# Patient Record
Sex: Male | Born: 1985 | Race: White | Hispanic: No | Marital: Single | Smoking: Current every day smoker
Health system: Southern US, Community
[De-identification: ages and names within clinical notes are randomized; demographics above are authoritative.]

---

## 2015-10-01 ENCOUNTER — Emergency Department
Admission: EM | Admit: 2015-10-01 | Discharge: 2015-10-01 | Disposition: A | Discharge status: Home / Self Care | Payer: Self-pay | Attending: Emergency Medicine | Admitting: Emergency Medicine

## 2015-10-01 ENCOUNTER — Encounter: Payer: Self-pay | Admitting: Emergency Medicine

## 2015-10-01 DIAGNOSIS — Y939 Activity, unspecified: Secondary | ICD-10-CM | POA: Insufficient documentation

## 2015-10-01 DIAGNOSIS — W540XXA Bitten by dog, initial encounter: Secondary | ICD-10-CM | POA: Insufficient documentation

## 2015-10-01 DIAGNOSIS — Y999 Unspecified external cause status: Secondary | ICD-10-CM | POA: Insufficient documentation

## 2015-10-01 DIAGNOSIS — S51851A Open bite of right forearm, initial encounter: Secondary | ICD-10-CM

## 2015-10-01 DIAGNOSIS — S51831A Puncture wound without foreign body of right forearm, initial encounter: Secondary | ICD-10-CM | POA: Insufficient documentation

## 2015-10-01 DIAGNOSIS — L03113 Cellulitis of right upper limb: Secondary | ICD-10-CM | POA: Insufficient documentation

## 2015-10-01 DIAGNOSIS — F17210 Nicotine dependence, cigarettes, uncomplicated: Secondary | ICD-10-CM | POA: Insufficient documentation

## 2015-10-01 DIAGNOSIS — Y929 Unspecified place or not applicable: Secondary | ICD-10-CM | POA: Insufficient documentation

## 2015-10-01 MED ORDER — AMOXICILLIN-POT CLAVULANATE 875-125 MG PO TABS: 1.0000 | ORAL_TABLET | Freq: Two times a day (BID) | ORAL | Status: AC

## 2015-10-01 MED ORDER — IBUPROFEN 800 MG PO TABS: 800.0000 mg | ORAL_TABLET | Freq: Three times a day (TID) | ORAL | Status: AC | PRN

## 2015-10-01 MED ORDER — AMOXICILLIN-POT CLAVULANATE 875-125 MG PO TABS
1.0000 | ORAL_TABLET | Freq: Once | ORAL | Status: AC
  Administered 2015-10-01: 1 via ORAL
  Filled 2015-10-01: qty 1

## 2015-10-01 MED ORDER — IBUPROFEN 800 MG PO TABS
800.0000 mg | ORAL_TABLET | Freq: Once | ORAL | Status: AC
  Administered 2015-10-01: 800 mg via ORAL
  Filled 2015-10-01: qty 1

## 2015-10-01 NOTE — ED Notes (Signed)
Discharge instructions reviewed with patient. Questions fielded by this RN. Patient verbalizes understanding of instructions. Patient discharged home in stable condition per Gaines PA. No acute distress noted at time of discharge.   

## 2015-10-01 NOTE — Discharge Instructions (Signed)
Animal Bite Animal bites can range from mild to serious. An animal bite can result in a scratch on the skin, a deep open cut, a puncture of the skin, a crush injury, or tearing away of the skin or a body part. A small bite from a house pet will usually not cause serious problems. However, some animal bites can become infected or injure a bone or other tissue.  Bites from certain animals can be more dangerous because of the risk of spreading rabies, which is a serious viral infection. This risk is higher with bites from stray animals or wild animals, such as raccoons, foxes, skunks, and bats. Dogs are responsible for most animal bites. Children are bitten more often than adults. SYMPTOMS  Common symptoms of an animal bite include:   Pain.   Bleeding.   Swelling.   Bruising.  DIAGNOSIS  This condition may be diagnosed based on a physical exam and medical history. Your health care provider will examine the wound and ask for details about the animal and how the bite happened. You may also have tests, such as:   Blood tests to check for infection or to determine if surgery is needed.  X-rays to check for damage to bones or joints.  Culture test. This uses a sample of fluid from the wound to check for infection. TREATMENT  Treatment varies depending on the location and type of animal bite and your medical history. Treatment may include:   Wound care. This often includes cleaning the wound, flushing the wound with saline solution, and applying a bandage (dressing). Sometimes, the wound is left open to heal because of the high risk of infection. However, in some cases, the wound may be closed with stitches (sutures), staples, skin glue, or adhesive strips.   Antibiotic medicine.   Tetanus shot.   Rabies treatment if the animal could have rabies.  In some cases, bites that have become infected may require IV antibiotics and surgical treatment in the hospital.  [place]  Follow instructions from your health care provider about how to take care of your wound. Make sure you:  Wash your hands with soap and water before you change your dressing. If soap and water are not available, use hand sanitizer.  Change your dressing as told by your health care provider.  Leave sutures, skin glue, or adhesive strips in place. These skin closures may need to be in place for 2 weeks or longer. If adhesive strip edges start to loosen and curl up, you may trim the loose edges. Do not remove adhesive strips completely unless your health care provider tells you to do that.  Check your wound every day for signs of infection. Watch for:   Increasing redness, swelling, or pain.   Fluid, blood, or pus.  General Instructions  Take or apply over-the-counter and prescription medicines only as told by your health care provider.   If you were prescribed an antibiotic, take or apply it as told by your health care provider. Do not stop using the antibiotic even if your condition improves.   Keep the injured area raised (elevated) above the level of your heart while you are sitting or lying down, if this is possible.   If directed, apply ice to the injured area.   Put ice in a plastic bag.   Place a towel between your skin and the bag.   Leave the ice on for 20 minutes, 2-3 times per day.  Keep all follow-up visits as told by your health care provider. This is important.  SEEK MEDICAL CARE IF:  You have increasing redness, swelling, or pain at the site of your wound.   You have a general feeling of sickness (malaise).   You feel nauseous or you vomit.   You have pain that does not get better.  SEEK IMMEDIATE MEDICAL CARE IF:  You have a red streak extending away from your wound.   You have fluid, blood, or pus coming from your wound.   You have a fever or chills.   You have trouble moving your injured area.   You  have numbness or tingling extending beyond the wound.   This information is not intended to replace advice given to you by your health care provider. Make sure you discuss any questions you have with your health care provider.   Document Released: 01/07/2011 Document Revised: 01/10/2015 Document Reviewed: 09/06/2014 Elsevier Interactive Patient Education 2016 [employer]  Cellulitis Cellulitis is an infection of the skin and the tissue beneath it. The infected area is usually red and tender. Cellulitis occurs most often in the arms and lower legs.  CAUSES  Cellulitis is caused by bacteria that enter the skin through cracks or cuts in the skin. The most common types of bacteria that cause cellulitis are staphylococci and streptococci. SIGNS AND SYMPTOMS   Redness and warmth.  Swelling.  Tenderness or pain.  Fever. DIAGNOSIS  Your health care provider can usually determine what is wrong based on a physical exam. Blood tests may also be done. TREATMENT  Treatment usually involves taking an antibiotic medicine. HOME CARE INSTRUCTIONS   Take your antibiotic medicine as directed by your health care provider. Finish the antibiotic even if you start to feel better.  Keep the infected arm or leg elevated to reduce swelling.  Apply a warm cloth to the affected area up to 4 times per day to relieve pain.  Take medicines only as directed by your health care provider.  Keep all follow-up visits as directed by your health care provider. SEEK MEDICAL CARE IF:   You notice red streaks coming from the infected area.  Your red area gets larger or turns dark in color.  Your bone or joint underneath the infected area becomes painful after the skin has healed.  Your infection returns in the same area or another area.  You notice a swollen bump in the infected area.  You develop new symptoms.  You have a fever. SEEK IMMEDIATE MEDICAL CARE IF:   You feel very sleepy.  You develop  vomiting or diarrhea.  You have a general ill feeling (malaise) with muscle aches and pains.   This information is not intended to replace advice given to you by your health care provider. Make sure you discuss any questions you have with your health care provider.   Document Released: 01/29/2005 Document Revised: 01/10/2015 Document Reviewed: 07/07/2011 Elsevier Interactive Patient Education 2016 [employer]  Please take Augmentin and ibuprofen as prescribed. He may apply ice 20 minutes every hour for the next 48 hours to the forearm. If any fevers, increased pain, swelling, redness, return to the emergency department.

## 2015-10-01 NOTE — ED Provider Notes (Signed)
CSN: [id number]     Arrival date & time 10/01/15  1927 History   First MD Initiated Contact with Patient 10/01/15 2031     Chief Complaint  Patient presents with  . Animal Bite     (Consider location/radiation/quality/duration/timing/severity/associated sxs/prior Treatment) HPI  30 year old male presents to emergency department for evaluation of right forearm dog bite. Patient states he was bit yesterday along the right forearm by his brother's dog. Dog is up-to-date on all vaccinations. Dog resides with his parents in [place]. Patient has mild pain in the right forearm, 3 out of 10 with flexing and extending his right wrist. He came in today because of increased redness along the puncture and along the volar aspect of the right forearm. Patient has not been taking any medications for pain or inflammation. He is not allergic to any medications. His tetanus is up-to-date. He denies any numbness or tingling in right upper extremity.  History reviewed. No pertinent past medical history. History reviewed. No pertinent past surgical history. History reviewed. No pertinent family history. Social History  Substance Use Topics  . Smoking status: Current Every Day Smoker -- 0.50 packs/day    Types: Cigarettes  . Smokeless tobacco: Never Used  . Alcohol Use: Yes     Comment: occ    Review of Systems  Constitutional: Negative.  Negative for fever, chills, activity change and appetite change.  HENT: Negative for congestion, ear pain, mouth sores, rhinorrhea, sinus pressure, sore throat and trouble swallowing.   Eyes: Negative for photophobia, pain and discharge.  Respiratory: Negative for cough, chest tightness and shortness of breath.   Cardiovascular: Negative for chest pain and leg swelling.  Gastrointestinal: Negative for nausea, vomiting, abdominal pain, diarrhea and abdominal distention.  Genitourinary: Negative for dysuria and difficulty urinating.  Musculoskeletal: Negative for  back pain, arthralgias and gait problem.  Skin: Positive for wound. Negative for color change and rash.  Neurological: Negative for dizziness and headaches.  Hematological: Negative for adenopathy.  Psychiatric/Behavioral: Negative for behavioral problems and agitation.      Allergies  Review of patient's allergies indicates no known allergies.  Home Medications   Prior to Admission medications   Medication Sig Start Date End Date Taking? Authorizing Provider  amoxicillin-clavulanate (AUGMENTIN) 875-125 MG tablet Take 1 tablet by mouth every 12 (twelve) hours. 10 days 10/01/15   Evon Slack, PA-C  ibuprofen (ADVIL,MOTRIN) 800 MG tablet Take 1 tablet (800 mg total) by mouth every 8 (eight) hours as needed. 10/01/15   Evon Slack, PA-C   BP 124/75 mmHg  Pulse 82  Temp(Src) 98.6 F (37 C) (Oral)  Resp 16  Ht  (1.702 m)  Wt 76.204 kg  BMI 26.31 kg/m2  SpO2 99% Physical Exam  Constitutional: He is oriented to person, place, and time. He appears well-developed and well-nourished.  HENT:  Head: Normocephalic and atraumatic.  Eyes: Conjunctivae and EOM are normal. Pupils are equal, round, and reactive to light.  Neck: Normal range of motion. Neck supple.  Cardiovascular: Normal rate and intact distal pulses.   Pulmonary/Chest: Effort normal. No respiratory distress.  Abdominal: Soft. Bowel sounds are normal.  Musculoskeletal:  Examination of the right forearm shows the patient has mild swelling on the volar aspect of the forearm with a small single puncture wound with no drainage and no gaping skin wound. There is no palpable foreign body. Puncture wound is partially healed. There is 5 cm diameter of surrounding erythema around the puncture wound. There is mild  warmth. He has full flexion and extension of the wrist as well as full composite fist with no significant discomfort. He has 2+ radial pulse and 2+ cap refill. Sensation is intact throughout the right upper extremity.   Neurological: He is alert and oriented to person, place, and time.  Skin: Skin is warm and dry.  Psychiatric: He has a normal mood and affect. His behavior is normal. Judgment and thought content normal.    ED Course  Procedures (including critical care time) Labs Review Labs Reviewed - No data to display  Imaging Review No results found. I have personally reviewed and evaluated these images and lab results as part of my medical decision-making.   EKG Interpretation None      MDM   Final diagnoses:  Dog bite of right forearm, initial encounter  Cellulitis of right forearm  Puncture wound of right forearm, initial encounter    30 year old male with puncture wound to the right forearm by a family dog. Dog's vaccinations are up-to-date. Patient is placed on Augmentin 875-125, one tab by mouth twice a day for 10 days. Given strict instructions on follow up if any increased pain and swelling warmth or fevers.  Evon Slackhomas C Gaines, PA-C 10/01/15 2053  Minna AntisKevin Paduchowski, MD 10/01/15 [phone]

## 2015-10-01 NOTE — ED Notes (Signed)
Pt presents to ED for dog bite on right forearm yesterday. Puncture wound noted on right forearm.Noted redness and swelling. Family dog and states vaccinated.

## 2015-10-01 NOTE — ED Notes (Signed)
Pt presents to ED to be evaluated for dog bite on right forearm yesterday. Puncture wound noted on right forearm. Family dog and states vaccinated.

## 2018-05-31 ENCOUNTER — Encounter: Payer: Self-pay | Admitting: Emergency Medicine

## 2018-05-31 ENCOUNTER — Other Ambulatory Visit: Payer: Self-pay

## 2018-05-31 ENCOUNTER — Emergency Department
Admission: EM | Admit: 2018-05-31 | Discharge: 2018-05-31 | Disposition: A | Discharge status: Home / Self Care | Payer: Self-pay | Attending: Student in an Organized Health Care Education/Training Program | Admitting: Student in an Organized Health Care Education/Training Program

## 2018-05-31 DIAGNOSIS — L232 Allergic contact dermatitis due to cosmetics: Secondary | ICD-10-CM | POA: Insufficient documentation

## 2018-05-31 DIAGNOSIS — F17210 Nicotine dependence, cigarettes, uncomplicated: Secondary | ICD-10-CM | POA: Insufficient documentation

## 2018-05-31 MED ORDER — SULFAMETHOXAZOLE-TRIMETHOPRIM 800-160 MG PO TABS: 1.0000 | ORAL_TABLET | Freq: Two times a day (BID) | ORAL | 0 refills | Status: AC

## 2018-05-31 MED ORDER — METHYLPREDNISOLONE SODIUM SUCC 125 MG IJ SOLR
125.0000 mg | Freq: Once | INTRAMUSCULAR | Status: AC
  Administered 2018-05-31: 125 mg via INTRAMUSCULAR
  Filled 2018-05-31: qty 2

## 2018-05-31 MED ORDER — HYDROXYZINE HCL 50 MG PO TABS: 50.0000 mg | ORAL_TABLET | Freq: Three times a day (TID) | ORAL | 0 refills | Status: AC | PRN

## 2018-05-31 MED ORDER — METHYLPREDNISOLONE 4 MG PO TBPK: ORAL_TABLET | ORAL | 0 refills | Status: AC

## 2018-05-31 MED ORDER — HYDROXYZINE HCL 50 MG PO TABS
50.0000 mg | ORAL_TABLET | Freq: Once | ORAL | Status: AC
  Administered 2018-05-31: 50 mg via ORAL
  Filled 2018-05-31: qty 1

## 2018-05-31 NOTE — ED Notes (Signed)

## 2018-05-31 NOTE — ED Provider Notes (Signed)
[facility] Emergency Department Provider Note   ____________________________________________   First MD Initiated Contact with Patient 05/31/18 1219     (approximate)  I have reviewed the triage vital signs and the nursing notes.   HISTORY  Chief Complaint Allergic Reaction    HPI Andrew Weeks is a 33 y.o. male patient complain of facial redness and burning since Saturday.  Patient stated he used a facial hair dye and since then the skin is become irritated.  Patient also noticed pustular lesions in his beard this morning.  Patient rates his pain discomfort a 3/10.  No palliative measure for complaint.    History reviewed. No pertinent past medical history.  There are no active problems to display for this patient.   History reviewed. No pertinent surgical history.  Prior to Admission medications   Medication Sig Start Date End Date Taking? Authorizing Provider  amoxicillin-clavulanate (AUGMENTIN) 875-125 MG tablet Take 1 tablet by mouth every 12 (twelve) hours. 10 days 10/01/15   Evon Slack, PA-C  hydrOXYzine (ATARAX/VISTARIL) 50 MG tablet Take 1 tablet (50 mg total) by mouth 3 (three) times daily as needed. 05/31/18   Joni Reining, PA-C  ibuprofen (ADVIL,MOTRIN) 800 MG tablet Take 1 tablet (800 mg total) by mouth every 8 (eight) hours as needed. 10/01/15   Evon Slack, PA-C  methylPREDNISolone (MEDROL DOSEPAK) 4 MG TBPK tablet Take Tapered dose as directed 05/31/18   Joni Reining, PA-C  sulfamethoxazole-trimethoprim (BACTRIM DS,SEPTRA DS) 800-160 MG tablet Take 1 tablet by mouth 2 (two) times daily. 05/31/18   Joni Reining, PA-C    Allergies Patient has no known allergies.  No family history on file.  Social History Social History   Tobacco Use  . Smoking status: Current Every Day Smoker    Packs/day: 0.50    Types: Cigarettes  . Smokeless tobacco: Never Used  Substance Use Topics  . Alcohol use: Yes    Comment: occ    . Drug use: No    Review of Systems Constitutional: No fever/chills Eyes: No visual changes. ENT: No sore throat. Cardiovascular: Denies chest pain. Respiratory: Denies shortness of breath. Gastrointestinal: No abdominal pain.  No nausea, no vomiting.  No diarrhea.  No constipation. Genitourinary: Negative for dysuria. Musculoskeletal: Negative for back pain. Skin: Facial erythema along the hairline.  Pustular lesions anterior inferior mandible. Neurological: Negative for headaches, focal weakness or numbness.   ____________________________________________   PHYSICAL EXAM:  VITAL SIGNS: ED Triage Vitals  Enc Vitals Group     BP 05/31/18 1028 110/75     Pulse Rate 05/31/18 1028 66     Resp 05/31/18 1028 16     Temp 05/31/18 1028 97.8 F (36.6 C)     Temp Source 05/31/18 1028 Tympanic     SpO2 05/31/18 1028 99 %     Weight 05/31/18 1024 167 lb 15.9 oz (76.2 kg)     Height --      Head Circumference --      Peak Flow --      Pain Score 05/31/18 1024 3     Pain Loc --      Pain Edu? --      Excl. in GC? --     Constitutional: Alert and oriented. Well appearing and in no acute distress. Cardiovascular: Normal rate, regular rhythm. Grossly normal heart sounds.  Good peripheral circulation. Respiratory: Normal respiratory effort.  No retractions. Lungs CTAB. Skin: Erythema around the hairline.  Pustular  lesions anterior inferior mandible. Psychiatric: Mood and affect are normal. Speech and behavior are normal.  ____________________________________________   LABS (all labs ordered are listed, but only abnormal results are displayed)  Labs Reviewed - No data to display ____________________________________________  EKG   ____________________________________________  RADIOLOGY  ED MD interpretation:    Official radiology report(s): No results found.  ____________________________________________   PROCEDURES  Procedure(s) performed:  None  Procedures  Critical Care performed: No  ____________________________________________   INITIAL IMPRESSION / ASSESSMENT AND PLAN / ED COURSE  As part of my medical decision making, I reviewed the following data within the electronic MEDICAL RECORD NUMBER     Contact dermatitis secondary to hair dye with mild cellulitis.  Patient given discharge care instruction.  Patient given Solu-Medrol Atarax prior to departure.  Patient given discharge care instruction advised take medication as directed.  Patient advised [facility].      ____________________________________________   FINAL CLINICAL IMPRESSION(S) / ED DIAGNOSES  Final diagnoses:  Allergic contact dermatitis due to cosmetics     ED Discharge Orders         Ordered    methylPREDNISolone (MEDROL DOSEPAK) 4 MG TBPK tablet     05/31/18 1312    hydrOXYzine (ATARAX/VISTARIL) 50 MG tablet  3 times daily PRN     05/31/18 1312    sulfamethoxazole-trimethoprim (BACTRIM DS,SEPTRA DS) 800-160 MG tablet  2 times daily     05/31/18 1312           Note:  This document was prepared using Dragon voice recognition software and may include unintentional dictation errors.    Joni Reining, PA-C 05/31/18 1332    Willy Eddy, MD 05/31/18 [phone]

## 2018-05-31 NOTE — ED Triage Notes (Addendum)
C/O facial redness and irritation since Saturday. States used facial hair dye and skin has been irritated since that time.  Also raised rash noted within beard.

## 2018-05-31 NOTE — ED Notes (Signed)
See triage note  Presents with rash to face  States he used hair dye on Saturday  Then noticed on itching and draining

## 2020-12-11 ENCOUNTER — Other Ambulatory Visit: Payer: Self-pay | Admitting: Unknown Physician Specialty

## 2020-12-11 DIAGNOSIS — R42 Dizziness and giddiness: Secondary | ICD-10-CM

## 2020-12-11 DIAGNOSIS — R519 Headache, unspecified: Secondary | ICD-10-CM

## 2020-12-22 ENCOUNTER — Other Ambulatory Visit: Payer: Self-pay

## 2020-12-22 ENCOUNTER — Ambulatory Visit
Admission: RE | Admit: 2020-12-22 | Discharge: 2020-12-22 | Disposition: A | Discharge status: Home / Self Care | Payer: No Typology Code available for payment source | Source: Ambulatory Visit | Attending: Unknown Physician Specialty | Admitting: Unknown Physician Specialty

## 2020-12-22 DIAGNOSIS — R42 Dizziness and giddiness: Secondary | ICD-10-CM

## 2020-12-22 DIAGNOSIS — R519 Headache, unspecified: Secondary | ICD-10-CM

## 2020-12-22 IMAGING — MR MR BRAIN/IAC WO/W CM
12 of 13 series · 38 of 48 positions shown · IV contrast (multihance)
Comparison: None.

CLINICAL DATA: Lightheadedness with dizziness. Stiffness and
swelling at base of skull.

EXAM:
MRI HEAD WITHOUT AND WITH CONTRAST
TECHNIQUE: Multiplanar, multiecho pulse sequences of the brain and surrounding
structures were obtained without and with intravenous contrast.
CONTRAST:  15mL MULTIHANCE GADOBENATE DIMEGLUMINE 529 MG/ML IV SOLN

[Series 2: T1 · sagittal · 5.0mm · 0.45mm/px · 3 of 21 slices shown (1 of 3)]
[im 1/21]
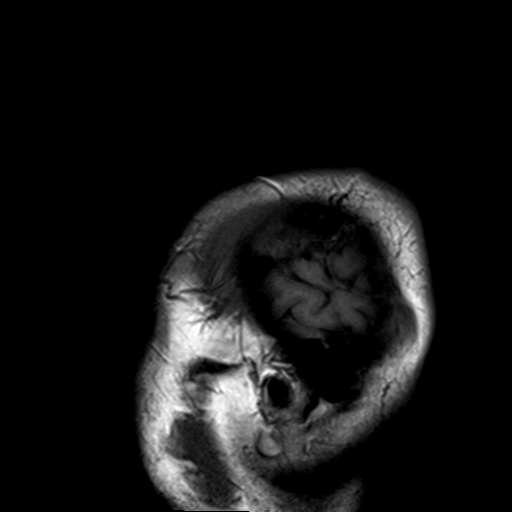
[im 11/21]
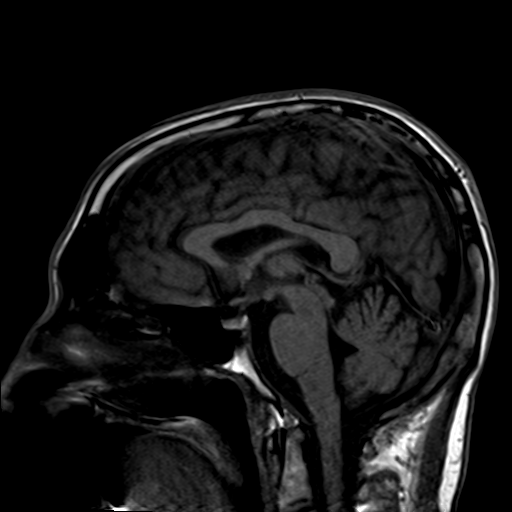
[im 21/21]
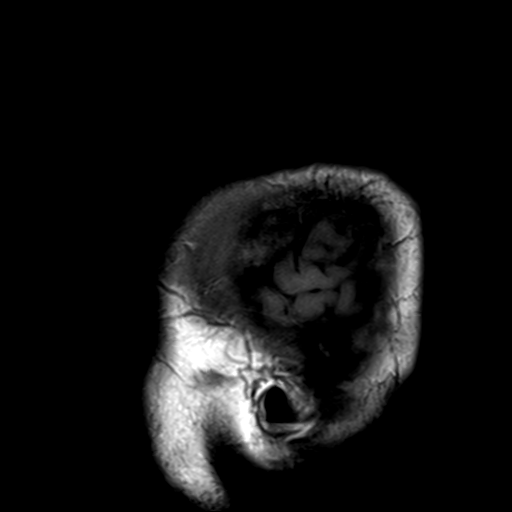

[Series 3: DWI · axial · 3.0mm · 1.80mm/px · z∈[-92,+55]mm · 11 of 100 slices shown (1 of 2)]
[im 1/100]
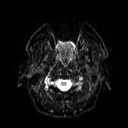
[im 10/100]
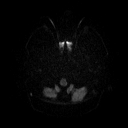
[im 20/100]
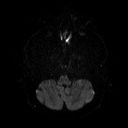
[im 30/100]
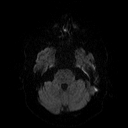
[im 40/100]
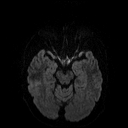
[im 50/100]
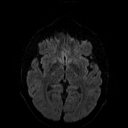
[im 60/100]
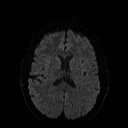
[im 70/100]
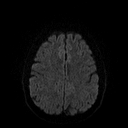
[im 80/100]
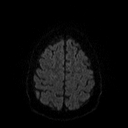
[im 90/100]
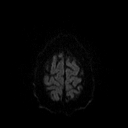
[im 100/100]
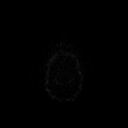

[Series 4: DWI · axial · 3.0mm · 1.80mm/px · z∈[-92,+55]mm · 5 of 48 slices shown (2 of 2)]
[im 1/48]
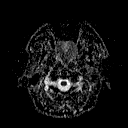
[im 12/48]
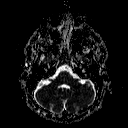
[im 24/48]
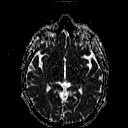
[im 36/48]
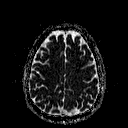
[im 48/48]
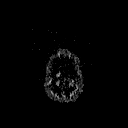

[Series 5: T2 · axial · 5.0mm · 0.45mm/px · z∈[-90,+53]mm · 2 of 23 slices shown]
[im 1/23]
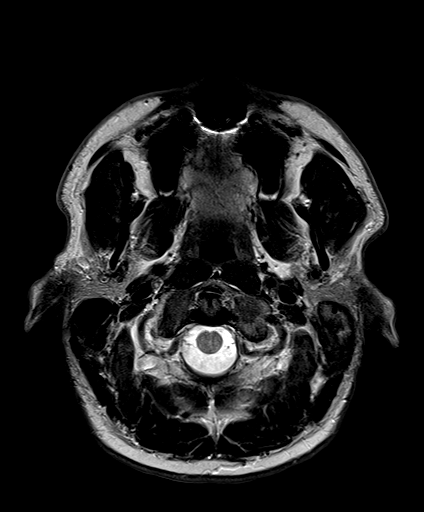
[im 23/23]
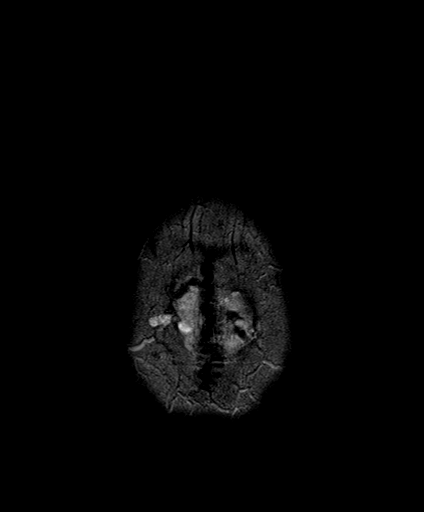

[Series 6: FLAIR · axial · 3.0mm · 0.45mm/px · z∈[-96,+59]mm · 3 of 27 slices shown (1 of 2)]
[im 1/27]
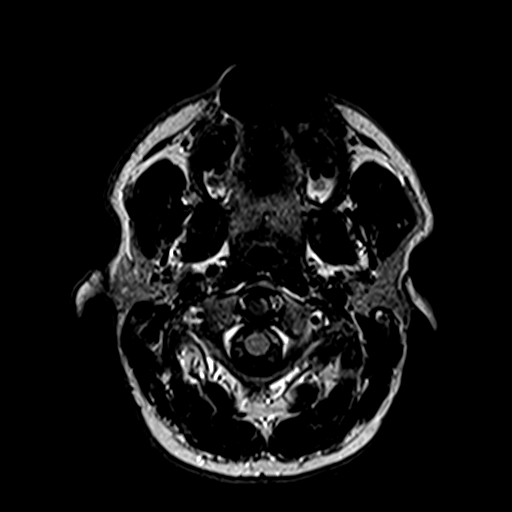
[im 14/27]
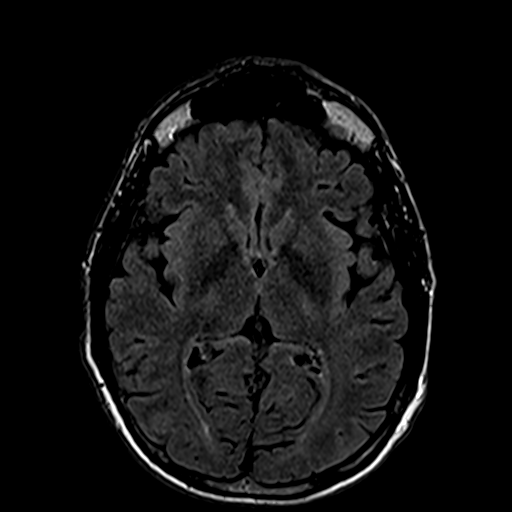
[im 27/27]
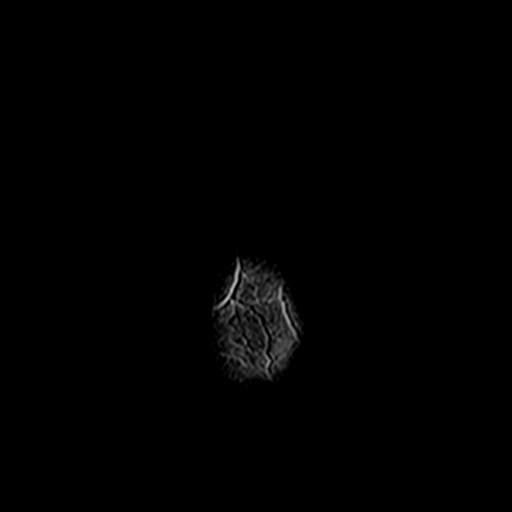

[Series 8: swi_images · axial · 3.0mm · 0.90mm/px · z∈[-89,+52]mm · 5 of 48 slices shown]
[im 1/48]
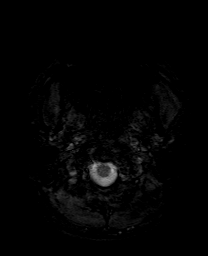
[im 12/48]
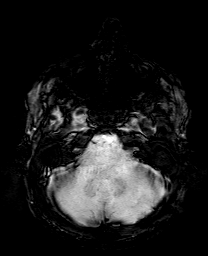
[im 24/48]
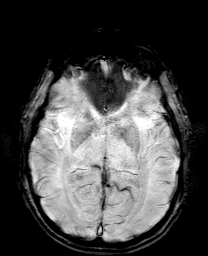
[im 36/48]
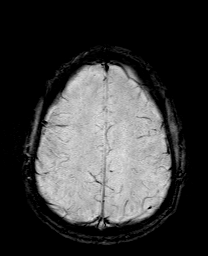
[im 48/48]
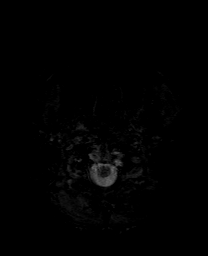

[Series 9: T1 · coronal · 3.0mm · 0.35mm/px · 1 of 14 slices shown (2 of 3)]
[im 1/14]
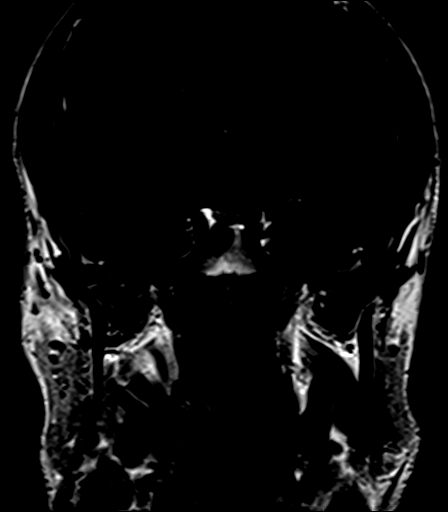

[Series 10: bSSFP · axial · 1.0mm · 0.28mm/px · z∈[-87,-74]mm · 2 of 40 slices shown]
[im 1/40]
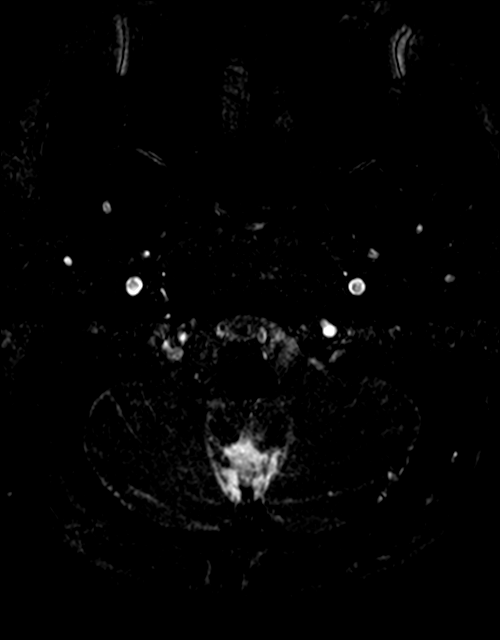
[im 14/40]
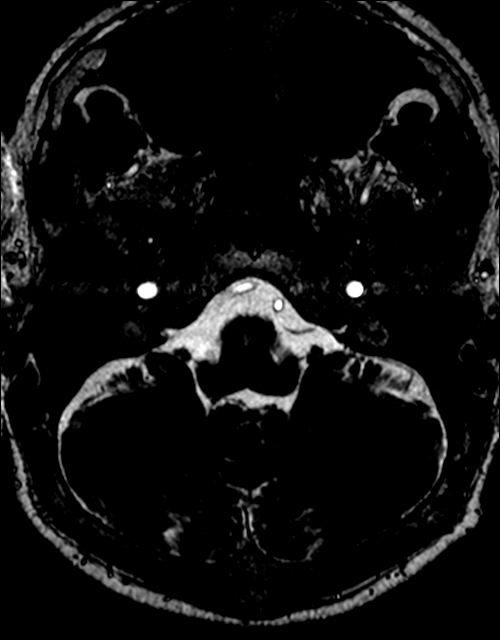

[Series 11: T1 · axial · 3.0mm · 0.35mm/px · 1 of 14 slices shown (3 of 3)]
[im 1/14]
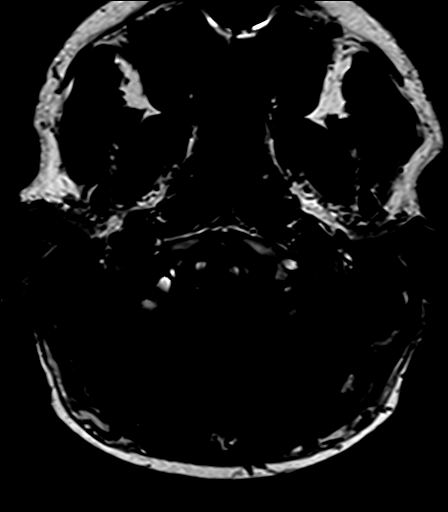

[Series 12: FLAIR · sagittal · 5.0mm · 0.45mm/px · 3 of 25 slices shown (2 of 2)]
[im 1/25]
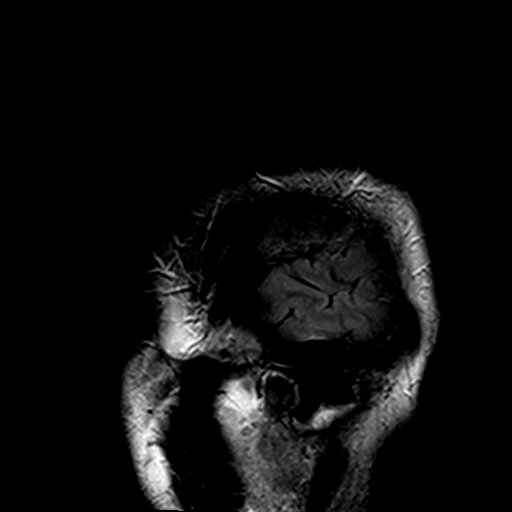
[im 13/25]
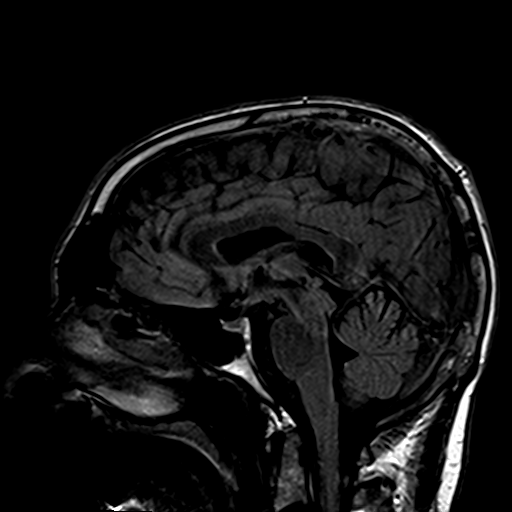
[im 25/25]
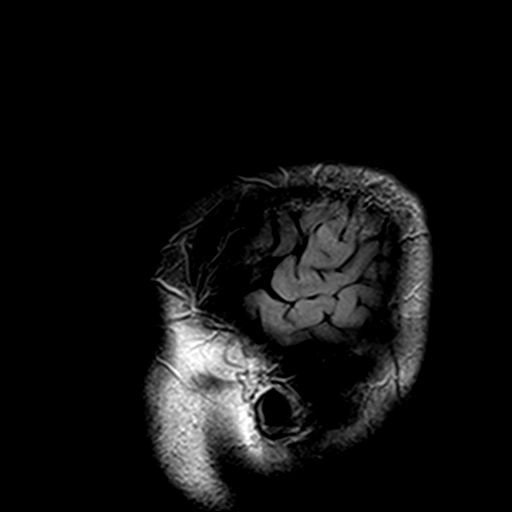

[Series 13: T1 post-contrast · coronal · 3.0mm · 0.35mm/px · 1 of 14 slices shown (1 of 2)]
[im 1/14]
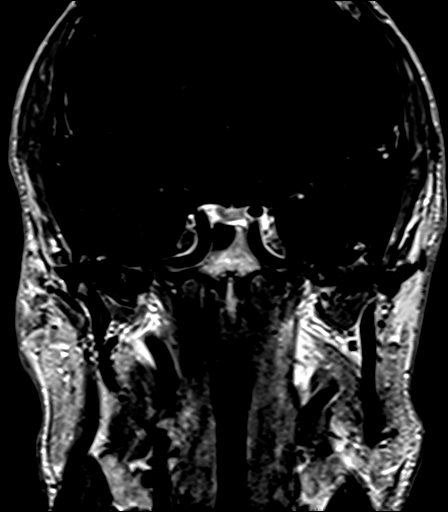

[Series 14: T1 post-contrast · axial · 3.0mm · 0.35mm/px · 1 of 14 slices shown (2 of 2)]
[im 1/14]
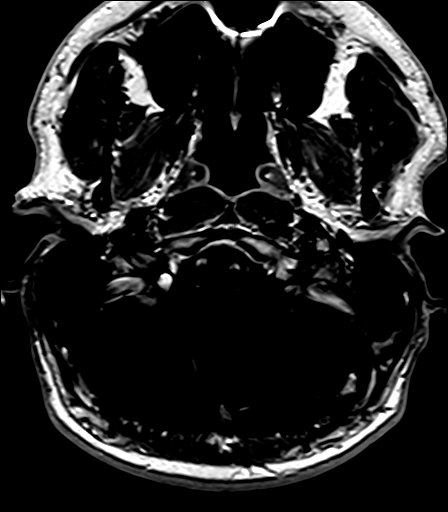

[38 of 48 positions shown; findings below may reference images not displayed]

FINDINGS: Brain: Symmetric normal labyrinthine signal. Normal appearance of
the cisterns, brainstem, and canalicular spaces.

No acute or subacute infarct, hemorrhage, hydrocephalus, or
collection.

7 mm FLAIR hyperintensity in the subcortical high left frontal lobe.
Small periventricular FLAIR hyperintensities about the frontal horn
of the left lateral ventricle and the temporal horn of the right
lateral ventricle. No associated enhancement or swelling.

Vascular: Normal flow voids and vascular enhancements.

Skull and upper cervical spine: Negative

Sinuses/Orbits: Negative
IMPRESSION: 1. No specific explanation for symptoms. Negative for retrocochlear
lesion.
2. Three small signal abnormalities in the periventricular and
juxtacortical white matter from nonspecific remote insult. A
follow-up can be obtained if there is any clinical concern for
demyelinating disease.

## 2020-12-22 MED ORDER — GADOBENATE DIMEGLUMINE 529 MG/ML IV SOLN
15.0000 mL | Freq: Once | INTRAVENOUS | Status: AC | PRN
  Administered 2020-12-22: 15 mL via INTRAVENOUS

## 2021-01-09 ENCOUNTER — Other Ambulatory Visit: Payer: Self-pay | Admitting: Neurology

## 2021-01-09 DIAGNOSIS — Z820 Family history of epilepsy and other diseases of the nervous system: Secondary | ICD-10-CM

## 2021-01-09 DIAGNOSIS — G379 Demyelinating disease of central nervous system, unspecified: Secondary | ICD-10-CM

## 2021-02-02 ENCOUNTER — Ambulatory Visit
Admission: RE | Admit: 2021-02-02 | Discharge: 2021-02-02 | Disposition: A | Discharge status: Home / Self Care | Payer: No Typology Code available for payment source | Source: Ambulatory Visit | Attending: Neurology | Admitting: Neurology

## 2021-02-02 ENCOUNTER — Other Ambulatory Visit: Payer: Self-pay

## 2021-02-02 DIAGNOSIS — G379 Demyelinating disease of central nervous system, unspecified: Secondary | ICD-10-CM

## 2021-02-02 DIAGNOSIS — Z820 Family history of epilepsy and other diseases of the nervous system: Secondary | ICD-10-CM

## 2021-02-02 IMAGING — MR MR THORACIC SPINE WO/W CM
8 of 17 series · 23 of 48 positions shown · IV contrast (15 ml multihance)
Comparison: None.

CLINICAL DATA: Initial evaluation for stiffness and posterior neck
and mid spine. Tingling in left leg in both upper extremities.
Family history of MS.

EXAM:
MRI CERVICAL AND THORACIC SPINE WITHOUT AND WITH CONTRAST
TECHNIQUE: Multiplanar and multiecho pulse sequences of the cervical spine, to
include the craniocervical junction and cervicothoracic junction,
and the thoracic spine, were obtained without and with intravenous
contrast.
CONTRAST:  15mL MULTIHANCE GADOBENATE DIMEGLUMINE 529 MG/ML IV SOLN

[Series 5: T1 · sagittal · 3.0mm · 0.66mm/px · 1 of 13 slices shown (1 of 4)]
[im 1/13]
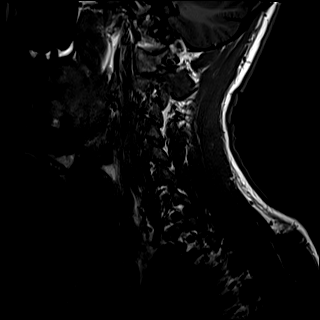

[Series 7: T2 · axial · 3.0mm · 0.50mm/px · z∈[-52,+52]mm · 4 of 33 slices shown (1 of 4)]
[im 1/33]
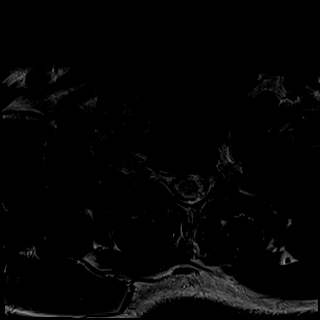
[im 11/33]
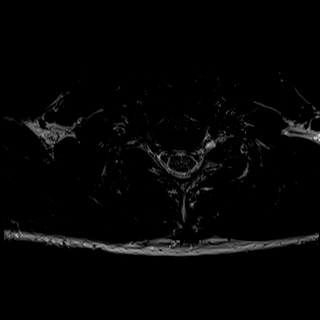
[im 22/33]
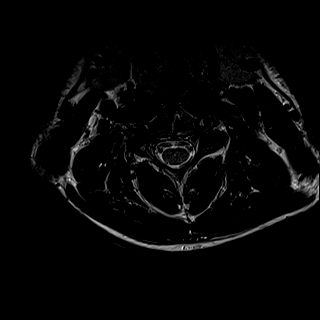
[im 33/33]
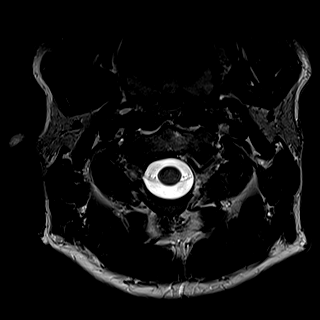

[Series 9: T1 · axial · non-contrast · 3.0mm · 0.31mm/px · z∈[-52,+52]mm · 4 of 33 slices shown (2 of 4)]
[im 1/33]
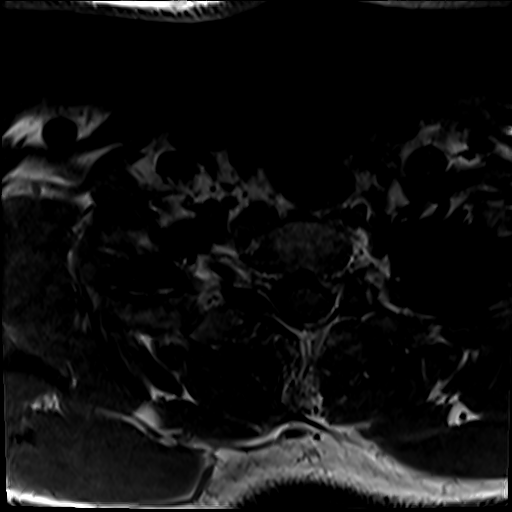
[im 11/33]
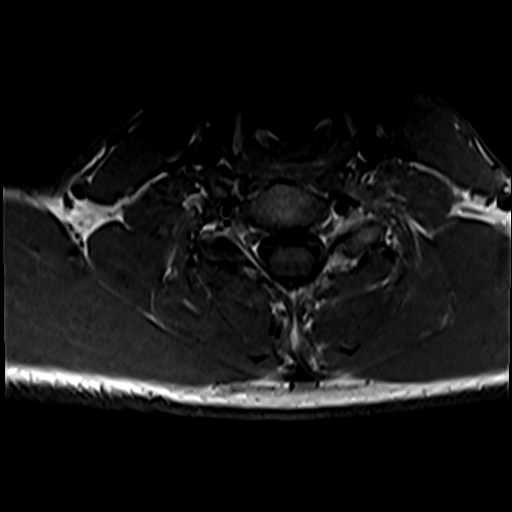
[im 22/33]
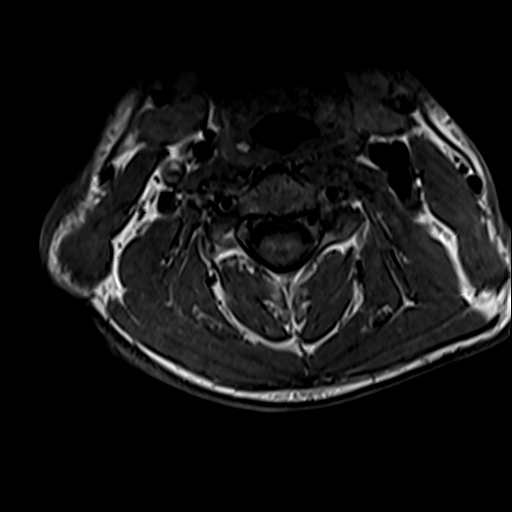
[im 33/33]
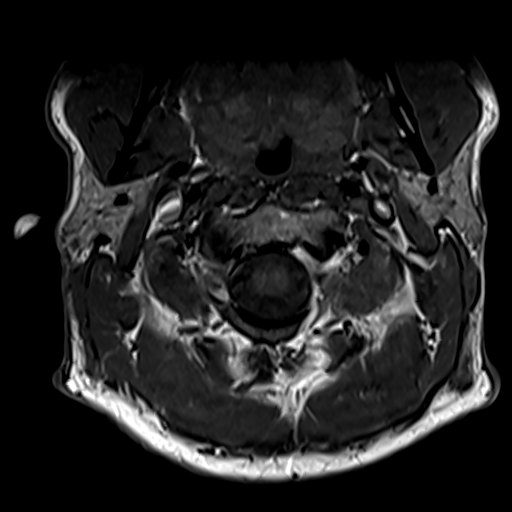

[Series 26: T1 · sagittal · 3.0mm · 0.67mm/px · 2 of 15 slices shown (3 of 4)]
[im 1/15]
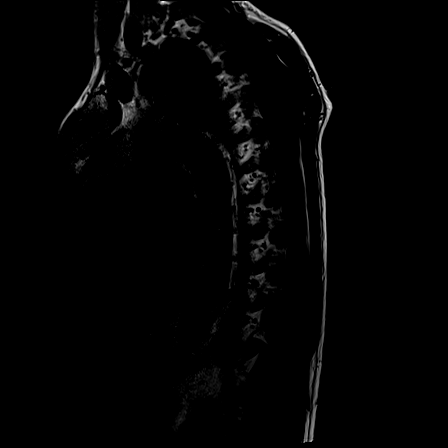
[im 15/15]
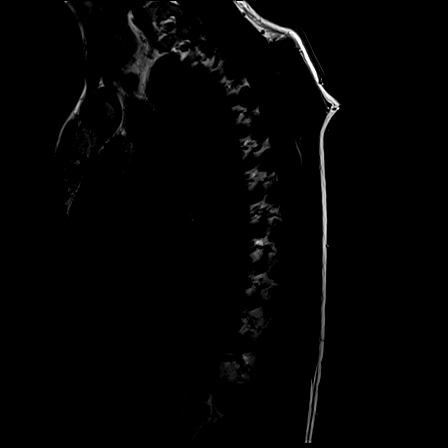

[Series 28: T2 · sagittal · 3.0mm · 0.67mm/px · 2 of 15 slices shown (2 of 4)]
[im 1/15]
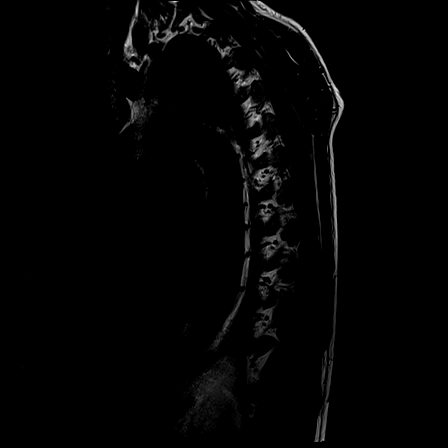
[im 15/15]
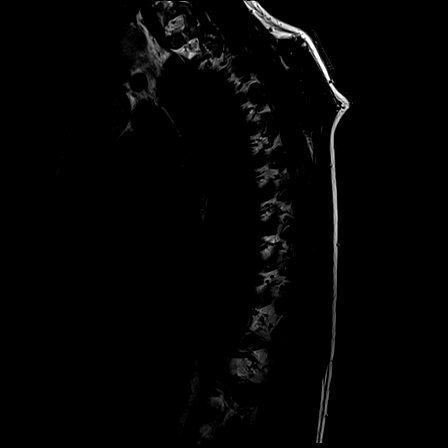

[Series 29: T2 · axial · 4.0mm · 0.28mm/px · z∈[-250,-57]mm · 4 of 36 slices shown (3 of 4)]
[im 1/36]
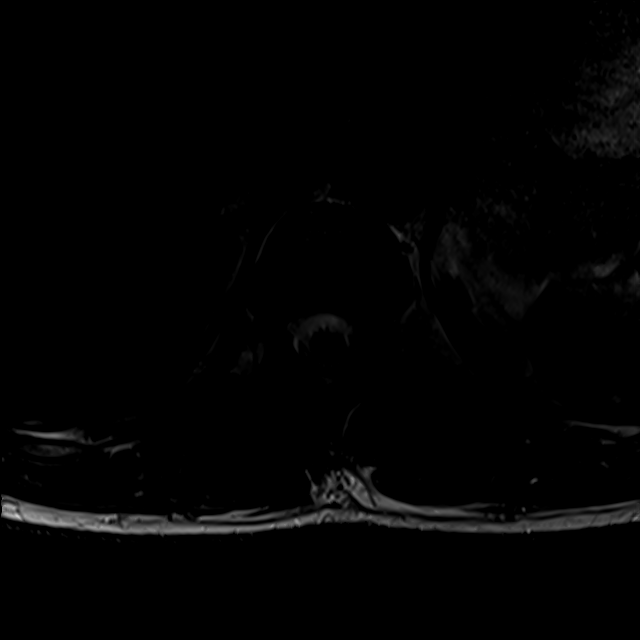
[im 12/36]
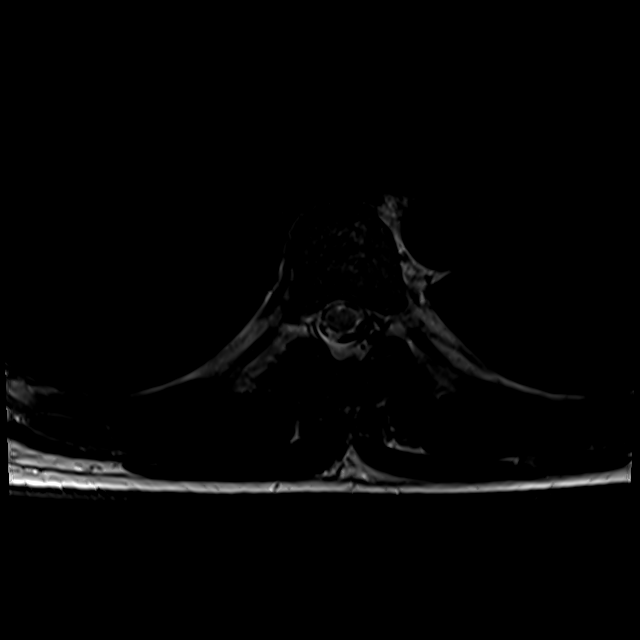
[im 24/36]
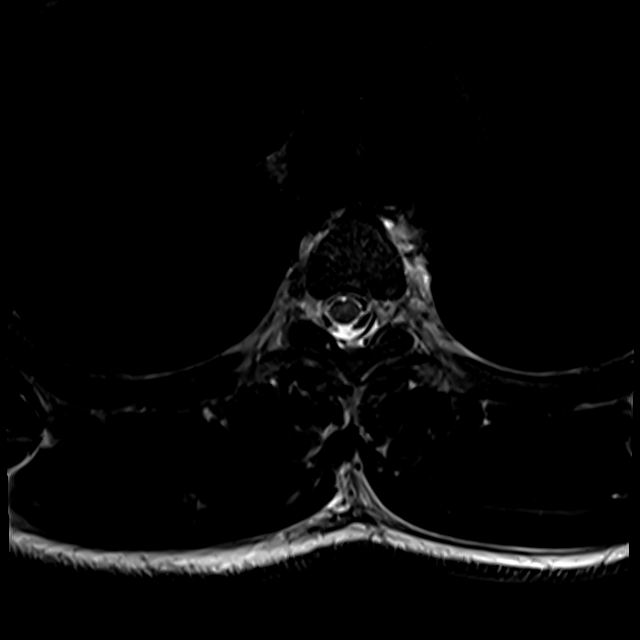
[im 36/36]
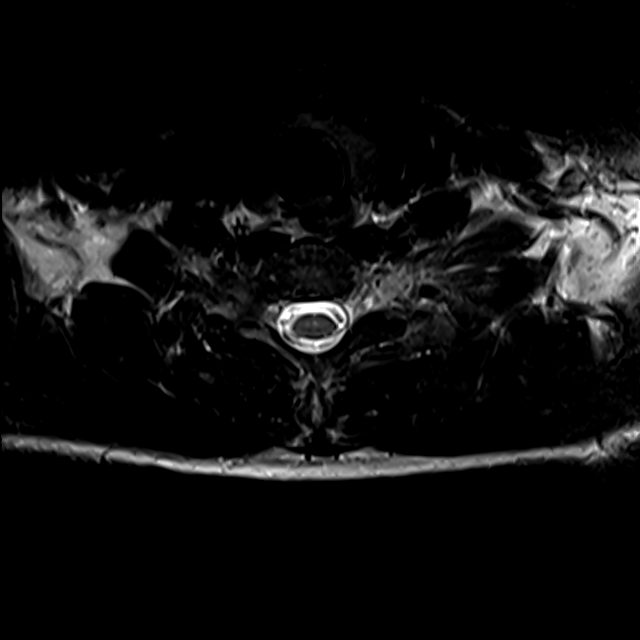

[Series 31: T1 · axial · non-contrast · 4.0mm · 0.56mm/px · z∈[-250,-57]mm · 4 of 36 slices shown (4 of 4)]
[im 1/36]
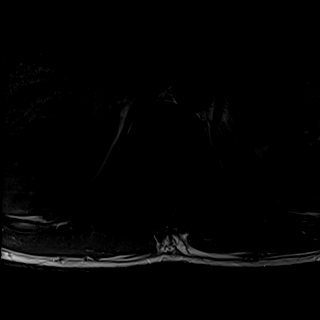
[im 12/36]
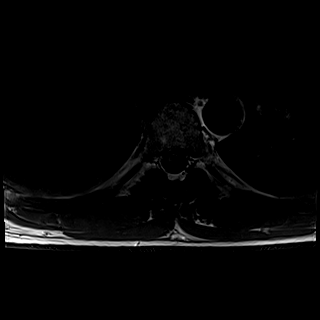
[im 24/36]
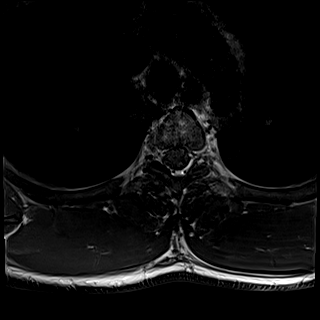
[im 36/36]
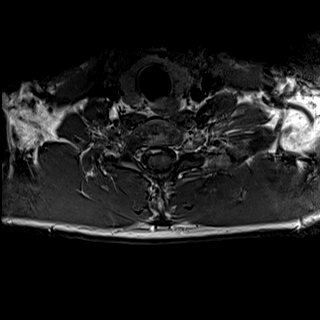

[Series 34: T2 · sagittal · 3.0mm · 0.55mm/px · 2 of 13 slices shown (4 of 4)]
[im 1/13]
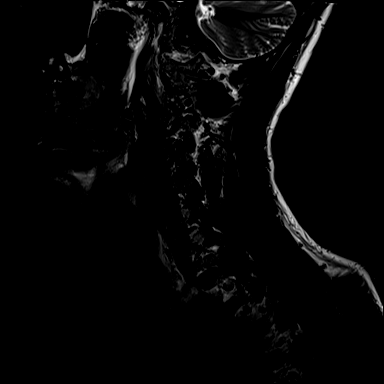
[im 13/13]
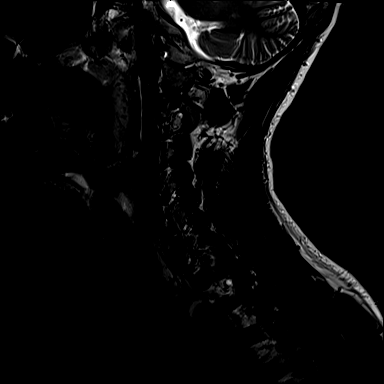

[23 of 48 positions shown; findings below may reference images not displayed]

FINDINGS: MRI CERVICAL SPINE FINDINGS

Alignment: Physiologic with preservation of the normal cervical
lordosis. No listhesis.

Vertebrae: Vertebral body height maintained without acute or chronic
fracture. Bone marrow signal intensity within normal limits. No
discrete or worrisome osseous lesions. No abnormal marrow edema or
enhancement.

Cord: Normal signal and morphology. No cord signal changes to
suggest demyelinating disease. Normal cord caliber morphology. No
abnormal enhancement.

Posterior Fossa, vertebral arteries, paraspinal tissues: Visualized
brain and posterior fossa within normal limits. Craniocervical
junction normal. Paraspinous and prevertebral soft tissues within
normal limits. Normal intravascular flow voids seen within the
vertebral arteries bilaterally.

Disc levels:

C2-C3: Minimal disc bulge with uncovertebral hypertrophy. No spinal
stenosis. Foramina remain patent.

C3-C4: Mild disc bulge with bilateral uncovertebral spurring. No
significant spinal stenosis. Mild left C4 foraminal narrowing. Right
neural foramina remains patent.

C4-C5: Minimal endplate spurring without significant disc bulge. No
canal or foraminal stenosis.

C5-C6:  Minimal annular disc bulge.  No canal or foraminal stenosis.

C6-C7:  Unremarkable.

C7-T1:  Unremarkable.

MRI THORACIC SPINE FINDINGS

Alignment: Underlying mild levoscoliosis. Alignment otherwise normal
with preservation of the normal thoracic kyphosis. No listhesis.

Vertebrae: Vertebral body height maintained without acute or chronic
fracture. Bone marrow signal intensity within normal limits. No
discrete or worrisome osseous lesions. Minimal reactive endplate
change present about the T6-7 and T10-11 interspaces. No other
abnormal marrow edema or enhancement.

Cord: Normal signal and morphology. No cord signal changes to
suggest demyelinating disease. No abnormal enhancement.

Paraspinal and other soft tissues: Unremarkable.

Disc levels:

Minimal discogenic reactive endplate change present about the C6-7
and T10-11 interspaces anteriorly. Otherwise, no other significant
disc pathology seen within the thoracic spine. No significant disc
bulge or focal disc herniation. No significant facet pathology. No
canal or neural foraminal stenosis or evidence for neural
impingement.
IMPRESSION: 1. Normal MRI appearance of the cervicothoracic spinal cord. No cord
signal changes to suggest demyelinating disease. No abnormal
enhancement.
2. Mild noncompressive disc bulging at C2-3 through C5-6 without
significant spinal stenosis.
3. Mild left C4 foraminal narrowing related to disc bulge and
uncovertebral disease.
4. Otherwise, no other significant disc pathology or stenosis within
the cervicothoracic spine. No frank neural impingement.

## 2021-02-02 IMAGING — MR MR CERVICAL SPINE WO/W CM
8 of 17 series · 23 of 48 positions shown · IV contrast (15 ml multihance)
Comparison: None.

CLINICAL DATA: Initial evaluation for stiffness and posterior neck
and mid spine. Tingling in left leg in both upper extremities.
Family history of MS.

EXAM:
MRI CERVICAL AND THORACIC SPINE WITHOUT AND WITH CONTRAST
TECHNIQUE: Multiplanar and multiecho pulse sequences of the cervical spine, to
include the craniocervical junction and cervicothoracic junction,
and the thoracic spine, were obtained without and with intravenous
contrast.
CONTRAST:  15mL MULTIHANCE GADOBENATE DIMEGLUMINE 529 MG/ML IV SOLN

[Series 5: T1 · sagittal · 3.0mm · 0.66mm/px · 1 of 13 slices shown (1 of 4)]
[im 1/13]
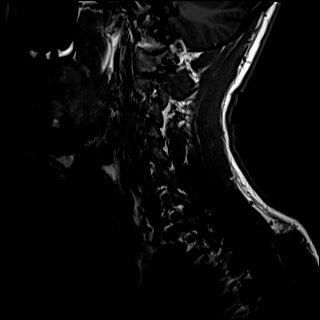

[Series 7: T2 · axial · 3.0mm · 0.50mm/px · z∈[-52,+52]mm · 4 of 33 slices shown (1 of 4)]
[im 1/33]
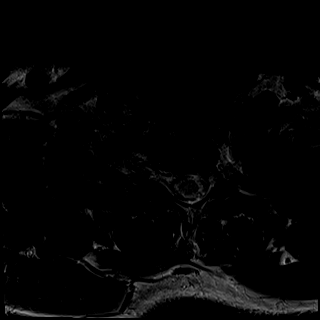
[im 11/33]
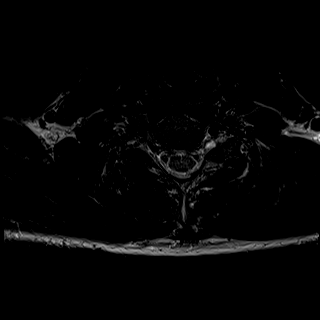
[im 22/33]
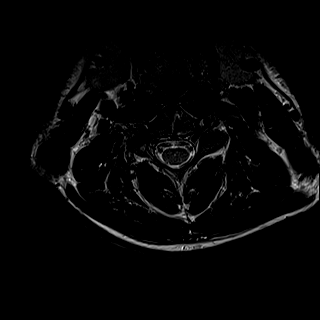
[im 33/33]
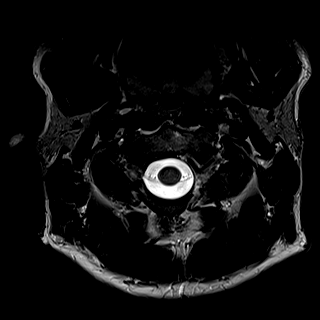

[Series 9: T1 · axial · non-contrast · 3.0mm · 0.31mm/px · z∈[-52,+52]mm · 4 of 33 slices shown (2 of 4)]
[im 1/33]
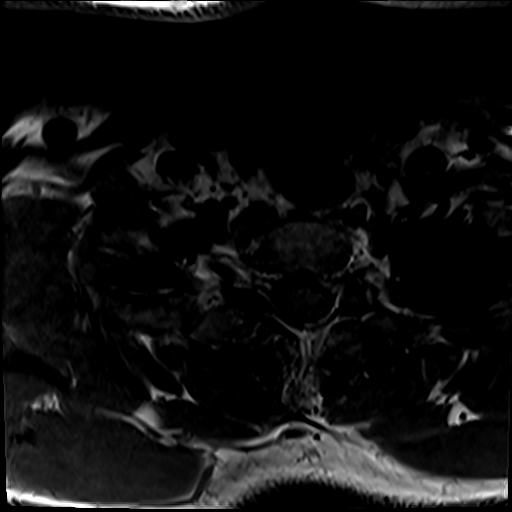
[im 11/33]
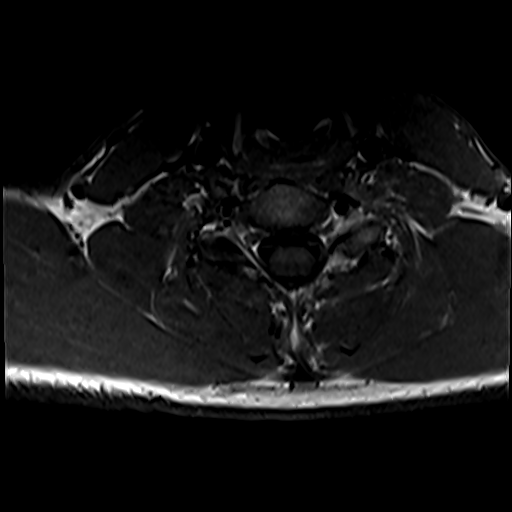
[im 22/33]
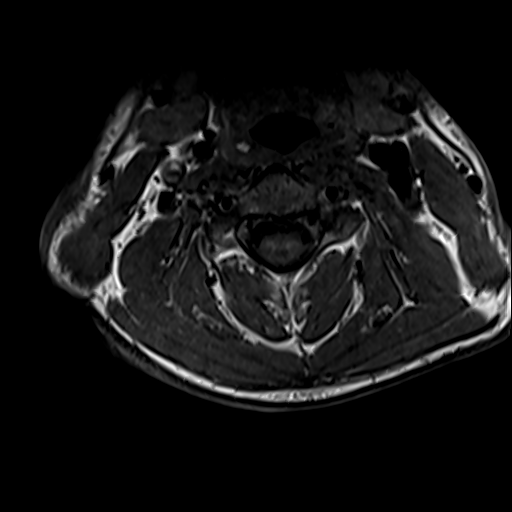
[im 33/33]
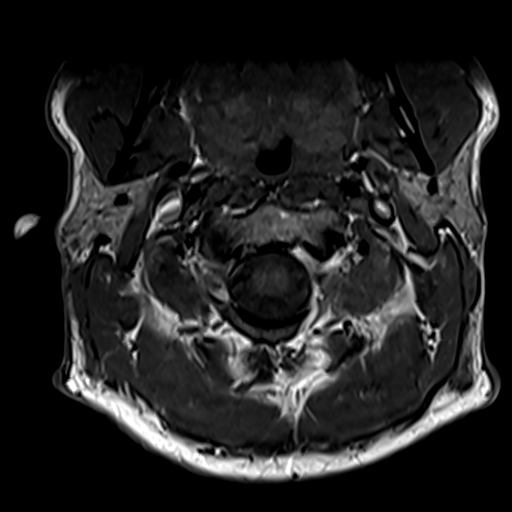

[Series 26: T1 · sagittal · 3.0mm · 0.67mm/px · 2 of 15 slices shown (3 of 4)]
[im 1/15]
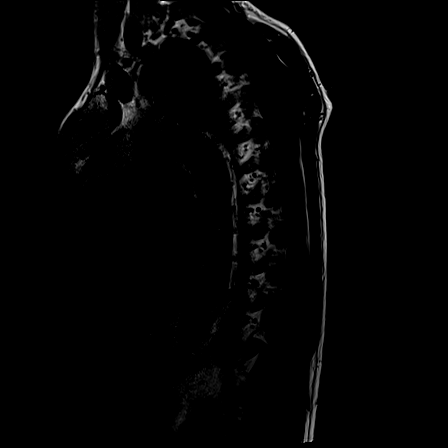
[im 15/15]
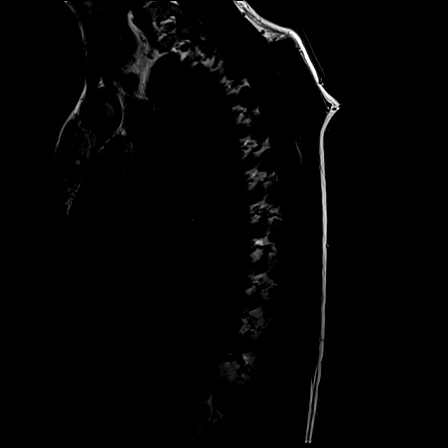

[Series 28: T2 · sagittal · 3.0mm · 0.67mm/px · 2 of 15 slices shown (2 of 4)]
[im 1/15]
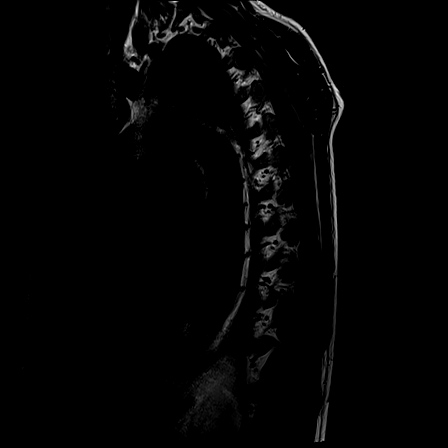
[im 15/15]
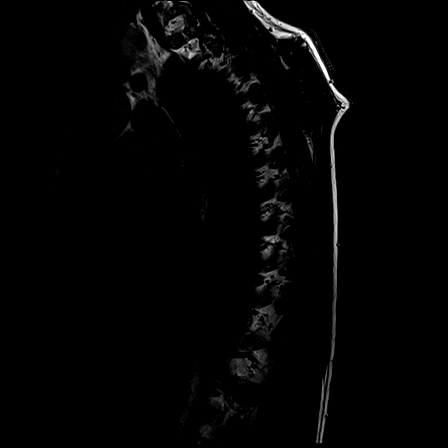

[Series 29: T2 · axial · 4.0mm · 0.28mm/px · z∈[-250,-57]mm · 4 of 36 slices shown (3 of 4)]
[im 1/36]
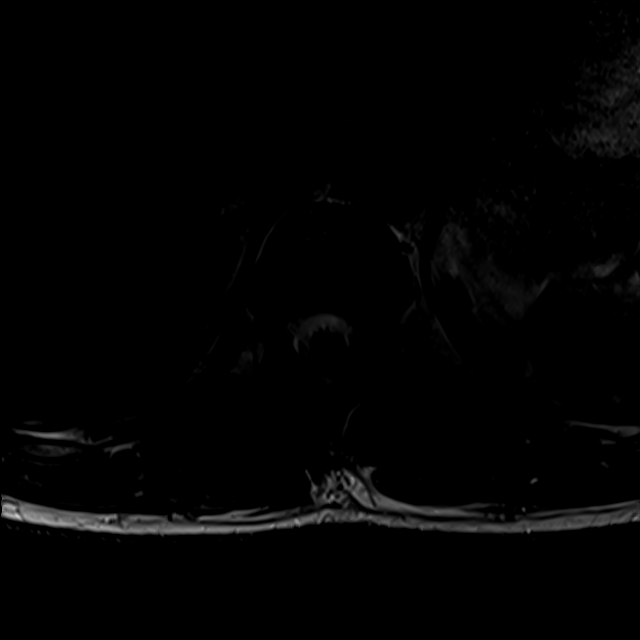
[im 12/36]
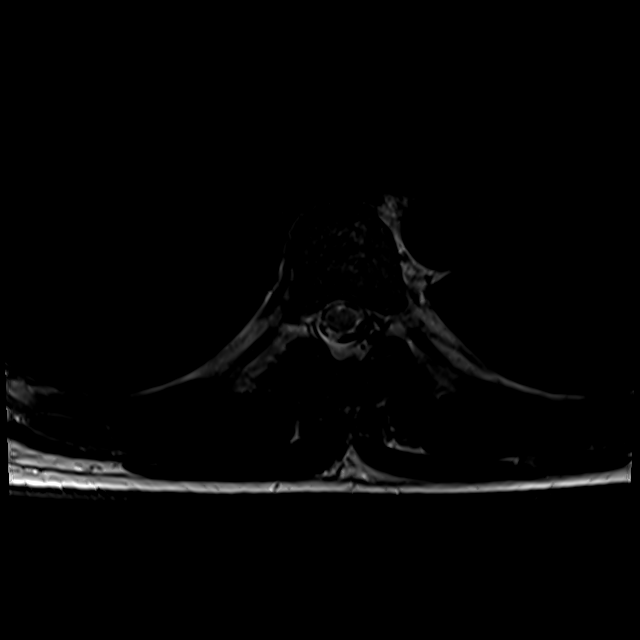
[im 24/36]
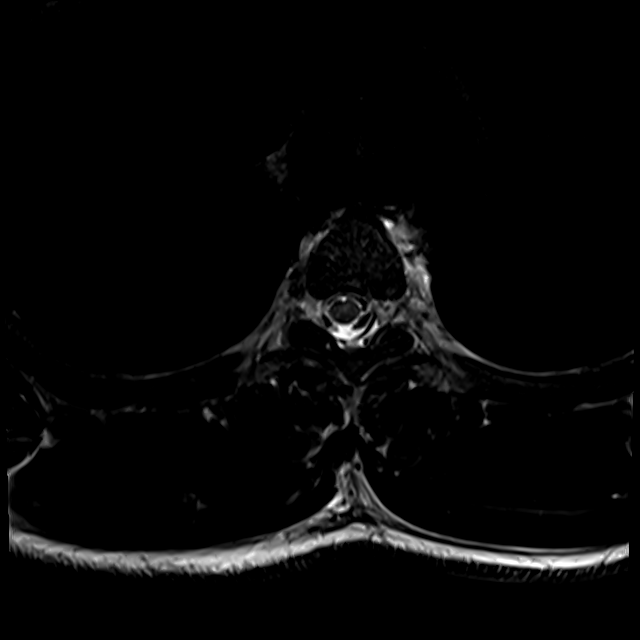
[im 36/36]
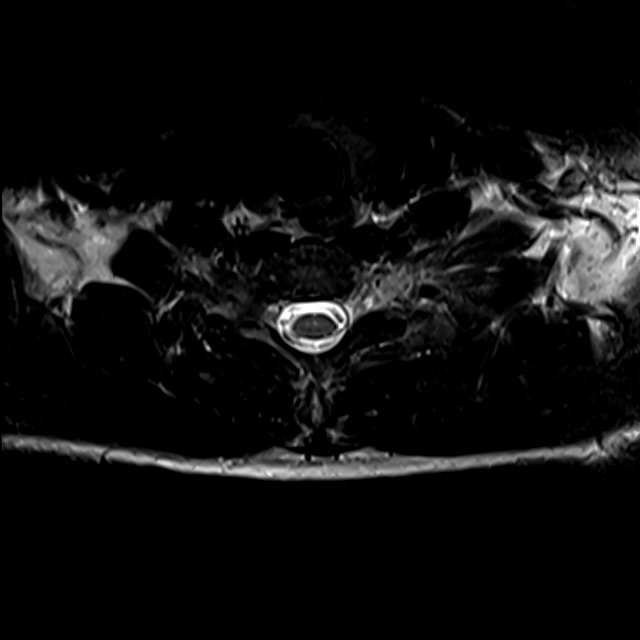

[Series 31: T1 · axial · non-contrast · 4.0mm · 0.56mm/px · z∈[-250,-57]mm · 4 of 36 slices shown (4 of 4)]
[im 1/36]
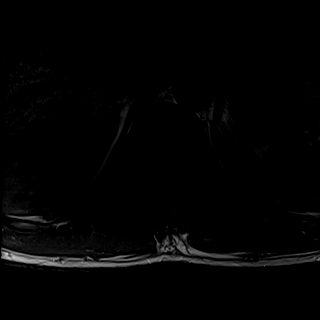
[im 12/36]
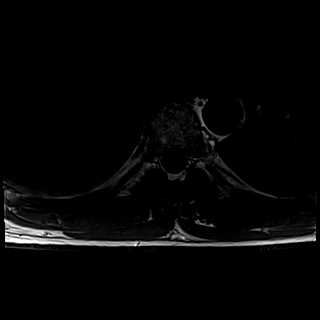
[im 24/36]
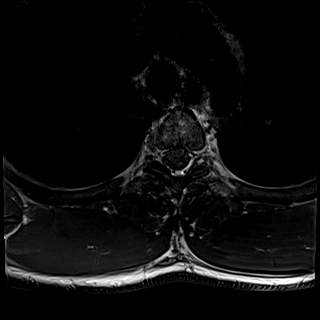
[im 36/36]
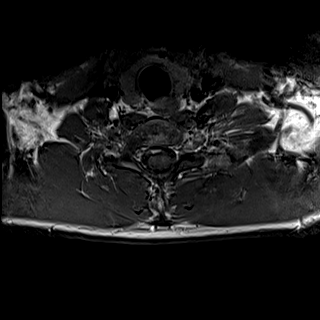

[Series 34: T2 · sagittal · 3.0mm · 0.55mm/px · 2 of 13 slices shown (4 of 4)]
[im 1/13]
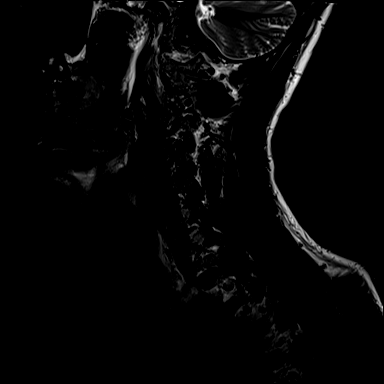
[im 13/13]
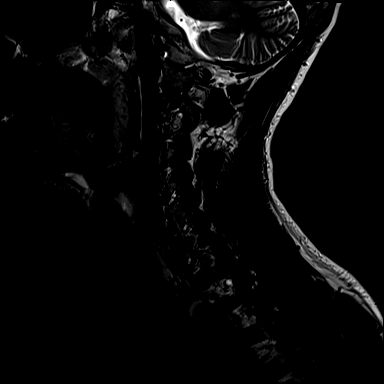

[23 of 48 positions shown; findings below may reference images not displayed]

FINDINGS: MRI CERVICAL SPINE FINDINGS

Alignment: Physiologic with preservation of the normal cervical
lordosis. No listhesis.

Vertebrae: Vertebral body height maintained without acute or chronic
fracture. Bone marrow signal intensity within normal limits. No
discrete or worrisome osseous lesions. No abnormal marrow edema or
enhancement.

Cord: Normal signal and morphology. No cord signal changes to
suggest demyelinating disease. Normal cord caliber morphology. No
abnormal enhancement.

Posterior Fossa, vertebral arteries, paraspinal tissues: Visualized
brain and posterior fossa within normal limits. Craniocervical
junction normal. Paraspinous and prevertebral soft tissues within
normal limits. Normal intravascular flow voids seen within the
vertebral arteries bilaterally.

Disc levels:

C2-C3: Minimal disc bulge with uncovertebral hypertrophy. No spinal
stenosis. Foramina remain patent.

C3-C4: Mild disc bulge with bilateral uncovertebral spurring. No
significant spinal stenosis. Mild left C4 foraminal narrowing. Right
neural foramina remains patent.

C4-C5: Minimal endplate spurring without significant disc bulge. No
canal or foraminal stenosis.

C5-C6:  Minimal annular disc bulge.  No canal or foraminal stenosis.

C6-C7:  Unremarkable.

C7-T1:  Unremarkable.

MRI THORACIC SPINE FINDINGS

Alignment: Underlying mild levoscoliosis. Alignment otherwise normal
with preservation of the normal thoracic kyphosis. No listhesis.

Vertebrae: Vertebral body height maintained without acute or chronic
fracture. Bone marrow signal intensity within normal limits. No
discrete or worrisome osseous lesions. Minimal reactive endplate
change present about the T6-7 and T10-11 interspaces. No other
abnormal marrow edema or enhancement.

Cord: Normal signal and morphology. No cord signal changes to
suggest demyelinating disease. No abnormal enhancement.

Paraspinal and other soft tissues: Unremarkable.

Disc levels:

Minimal discogenic reactive endplate change present about the C6-7
and T10-11 interspaces anteriorly. Otherwise, no other significant
disc pathology seen within the thoracic spine. No significant disc
bulge or focal disc herniation. No significant facet pathology. No
canal or neural foraminal stenosis or evidence for neural
impingement.
IMPRESSION: 1. Normal MRI appearance of the cervicothoracic spinal cord. No cord
signal changes to suggest demyelinating disease. No abnormal
enhancement.
2. Mild noncompressive disc bulging at C2-3 through C5-6 without
significant spinal stenosis.
3. Mild left C4 foraminal narrowing related to disc bulge and
uncovertebral disease.
4. Otherwise, no other significant disc pathology or stenosis within
the cervicothoracic spine. No frank neural impingement.

## 2021-02-02 MED ORDER — GADOBENATE DIMEGLUMINE 529 MG/ML IV SOLN
15.0000 mL | Freq: Once | INTRAVENOUS | Status: AC | PRN
  Administered 2021-02-02: 15 mL via INTRAVENOUS

## 2021-02-04 ENCOUNTER — Other Ambulatory Visit: Payer: Self-pay | Admitting: Neurology

## 2021-02-04 DIAGNOSIS — Z820 Family history of epilepsy and other diseases of the nervous system: Secondary | ICD-10-CM

## 2021-02-04 DIAGNOSIS — G379 Demyelinating disease of central nervous system, unspecified: Secondary | ICD-10-CM

## 2021-02-15 ENCOUNTER — Ambulatory Visit: Payer: Self-pay
